# Patient Record
Sex: Female | Born: 1965 | Race: Black or African American | Hispanic: No | Marital: Married | Smoking: Never smoker
Health system: Southern US, Community
[De-identification: ages and names within clinical notes are randomized; demographics above are authoritative.]

## PROBLEM LIST (undated history)

## (undated) DIAGNOSIS — E0500 Thyrotoxicosis with diffuse goiter without thyrotoxic crisis or storm: Secondary | ICD-10-CM

## (undated) DIAGNOSIS — R87619 Unspecified abnormal cytological findings in specimens from cervix uteri: Secondary | ICD-10-CM

## (undated) HISTORY — PX: OTHER SURGICAL HISTORY: SHX169

## (undated) HISTORY — DX: Unspecified abnormal cytological findings in specimens from cervix uteri: R87.619

## (undated) HISTORY — DX: Thyrotoxicosis with diffuse goiter without thyrotoxic crisis or storm: E05.00

---

## 2006-09-28 HISTORY — PX: OTHER SURGICAL HISTORY: SHX169

## 2006-09-28 HISTORY — PX: COLPOSCOPY: SHX161

## 2016-11-03 ENCOUNTER — Other Ambulatory Visit: Payer: Self-pay | Admitting: Obstetrics and Gynecology

## 2016-11-03 DIAGNOSIS — K4030 Unilateral inguinal hernia, with obstruction, without gangrene, not specified as recurrent: Secondary | ICD-10-CM

## 2016-11-16 ENCOUNTER — Other Ambulatory Visit: Payer: Self-pay | Admitting: Obstetrics and Gynecology

## 2016-11-16 DIAGNOSIS — R591 Generalized enlarged lymph nodes: Secondary | ICD-10-CM

## 2016-11-16 DIAGNOSIS — K4030 Unilateral inguinal hernia, with obstruction, without gangrene, not specified as recurrent: Secondary | ICD-10-CM

## 2016-11-23 ENCOUNTER — Ambulatory Visit
Admission: RE | Admit: 2016-11-23 | Discharge: 2016-11-23 | Disposition: A | Discharge status: Home / Self Care | Payer: Managed Care, Other (non HMO) | Source: Ambulatory Visit | Attending: Obstetrics and Gynecology | Admitting: Obstetrics and Gynecology

## 2016-11-23 DIAGNOSIS — R591 Generalized enlarged lymph nodes: Secondary | ICD-10-CM | POA: Diagnosis not present

## 2016-11-23 DIAGNOSIS — K4030 Unilateral inguinal hernia, with obstruction, without gangrene, not specified as recurrent: Secondary | ICD-10-CM

## 2016-12-07 ENCOUNTER — Ambulatory Visit: Payer: Self-pay

## 2016-12-14 ENCOUNTER — Ambulatory Visit: Payer: Self-pay

## 2017-01-19 ENCOUNTER — Other Ambulatory Visit: Payer: Self-pay | Admitting: Obstetrics and Gynecology

## 2017-01-20 NOTE — Telephone Encounter (Signed)
AMS, Pt has an annual with you on 5/23. Please advise on refill

## 2017-01-20 NOTE — Telephone Encounter (Signed)
Pt aware, via voicemail, Rx was refilled until her appointment

## 2017-02-17 ENCOUNTER — Ambulatory Visit: Payer: Self-pay | Admitting: Obstetrics and Gynecology

## 2017-03-19 ENCOUNTER — Ambulatory Visit (INDEPENDENT_AMBULATORY_CARE_PROVIDER_SITE_OTHER): Payer: Managed Care, Other (non HMO) | Admitting: Obstetrics and Gynecology

## 2017-03-19 ENCOUNTER — Encounter: Payer: Self-pay | Admitting: Obstetrics and Gynecology

## 2017-03-19 DIAGNOSIS — Z01419 Encounter for gynecological examination (general) (routine) without abnormal findings: Secondary | ICD-10-CM | POA: Diagnosis not present

## 2017-03-19 DIAGNOSIS — Z1211 Encounter for screening for malignant neoplasm of colon: Secondary | ICD-10-CM | POA: Diagnosis not present

## 2017-03-19 DIAGNOSIS — E039 Hypothyroidism, unspecified: Secondary | ICD-10-CM | POA: Diagnosis not present

## 2017-03-19 NOTE — Patient Instructions (Signed)
Screening recommended starting at age 49 for average risk individuals, age 40 for individuals deemed at increased risk (including African Americans) and recommended to continue until age 75.  For patient age 76-85 individualized approach is recommended.  Gold standard screening is via colonoscopy, Cologuard screening is an acceptable alternative for patient unwilling or unable to undergo colonoscopy.  "Colorectal cancer screening for average?risk adults: 2018 guideline update from the American Cancer Society"CA: A Cancer Journal for Clinicians: Feb 24, 2017    Preventive Care 40-64 Years, Female Preventive care refers to lifestyle choices and visits with your health care provider that can promote health and wellness. What does preventive care include?  A yearly physical exam. This is also called an annual well check.  Dental exams once or twice a year.  Routine eye exams. Ask your health care provider how often you should have your eyes checked.  Personal lifestyle choices, including: ? Daily care of your teeth and gums. ? Regular physical activity. ? Eating a healthy diet. ? Avoiding tobacco and drug use. ? Limiting alcohol use. ? Practicing safe sex. ? Taking low-dose aspirin daily starting at age 50. ? Taking vitamin and mineral supplements as recommended by your health care provider. What happens during an annual well check? The services and screenings done by your health care provider during your annual well check will depend on your age, overall health, lifestyle risk factors, and family history of disease. Counseling Your health care provider may ask you questions about your:  Alcohol use.  Tobacco use.  Drug use.  Emotional well-being.  Home and relationship well-being.  Sexual activity.  Eating habits.  Work and work environment.  Method of birth control.  Menstrual cycle.  Pregnancy history.  Screening You may have the following tests or  measurements:  Height, weight, and BMI.  Blood pressure.  Lipid and cholesterol levels. These may be checked every 5 years, or more frequently if you are over 50 years old.  Skin check.  Lung cancer screening. You may have this screening every year starting at age 55 if you have a 30-pack-year history of smoking and currently smoke or have quit within the past 15 years.  Fecal occult blood test (FOBT) of the stool. You may have this test every year starting at age 50.  Flexible sigmoidoscopy or colonoscopy. You may have a sigmoidoscopy every 5 years or a colonoscopy every 10 years starting at age 50.  Hepatitis C blood test.  Hepatitis B blood test.  Sexually transmitted disease (STD) testing.  Diabetes screening. This is done by checking your blood sugar (glucose) after you have not eaten for a while (fasting). You may have this done every 1-3 years.  Mammogram. This may be done every 1-2 years. Talk to your health care provider about when you should start having regular mammograms. This may depend on whether you have a family history of breast cancer.  BRCA-related cancer screening. This may be done if you have a family history of breast, ovarian, tubal, or peritoneal cancers.  Pelvic exam and Pap test. This may be done every 3 years starting at age 21. Starting at age 30, this may be done every 5 years if you have a Pap test in combination with an HPV test.  Bone density scan. This is done to screen for osteoporosis. You may have this scan if you are at high risk for osteoporosis.  Discuss your test results, treatment options, and if necessary, the need for more tests with your   health care provider. Vaccines Your health care provider may recommend certain vaccines, such as:  Influenza vaccine. This is recommended every year.  Tetanus, diphtheria, and acellular pertussis (Tdap, Td) vaccine. You may need a Td booster every 10 years.  Varicella vaccine. You may need this if  you have not been vaccinated.  Zoster vaccine. You may need this after age 60.  Measles, mumps, and rubella (MMR) vaccine. You may need at least one dose of MMR if you were born in 1957 or later. You may also need a second dose.  Pneumococcal 13-valent conjugate (PCV13) vaccine. You may need this if you have certain conditions and were not previously vaccinated.  Pneumococcal polysaccharide (PPSV23) vaccine. You may need one or two doses if you smoke cigarettes or if you have certain conditions.  Meningococcal vaccine. You may need this if you have certain conditions.  Hepatitis A vaccine. You may need this if you have certain conditions or if you travel or work in places where you may be exposed to hepatitis A.  Hepatitis B vaccine. You may need this if you have certain conditions or if you travel or work in places where you may be exposed to hepatitis B.  Haemophilus influenzae type b (Hib) vaccine. You may need this if you have certain conditions.  Talk to your health care provider about which screenings and vaccines you need and how often you need them. This information is not intended to replace advice given to you by your health care provider. Make sure you discuss any questions you have with your health care provider. Document Released: 10/11/2015 Document Revised: 06/03/2016 Document Reviewed: 07/16/2015 Elsevier Interactive Patient Education  2017 Elsevier Inc.  

## 2017-03-19 NOTE — Progress Notes (Signed)
Patient ID: Holly [name], female   DOB: Apr 28, 1966, 51 y.o.   MRN: [id number]     Gynecology Annual Exam  PCP: Vena Austria, MD  Chief Complaint:  Chief Complaint  Patient presents with  . Gynecologic Exam    History of Present Illness: Patient is a 51 y.o. [id number] presents for annual exam. The patient has no complaints today.   LMP: Patient's last menstrual period was 03/12/2017 (exact date). Average Interval: regular, 28 days Duration of flow: 3-4 days Heavy Menses: no Clots: no Intermenstrual Bleeding: no Postcoital Bleeding: no Dysmenorrhea: no   The patient is sexually active. She currently uses IUD for contraception. She denies dyspareunia.  The patient does not perform self breast exams.  There is no notable family history of breast or ovarian cancer in her family.  The patient wears seatbelts: yes.   The patient has regular exercise: yes.    The patient denies current symptoms of depression.    Review of Systems: Review of Systems  Constitutional: Negative for chills and fever.  HENT: Negative for congestion.   Respiratory: Negative for cough and shortness of breath.   Cardiovascular: Negative for chest pain and palpitations.  Gastrointestinal: Negative for abdominal pain, constipation, diarrhea, heartburn, nausea and vomiting.  Genitourinary: Negative for dysuria, frequency and urgency.  Skin: Negative for itching and rash.  Neurological: Negative for dizziness and headaches.  Endo/Heme/Allergies: Negative for polydipsia.  Psychiatric/Behavioral: Negative for depression.    Past Medical History:  History reviewed. No pertinent past medical history.  Past Surgical History:  History reviewed. No pertinent surgical history.  Gynecologic History:  Patient's last menstrual period was 03/12/2017 (exact date). Contraception: IUD Mirena 01/16/12 Last Pap: Results were: 01/18/2015 HPV primary screening negative Last mammogram: 12/03/14 [facility]) Results  were: BI-RAD I Obstetric History: [id number]  Family History:  History reviewed. No pertinent family history.  Social History:  Social History   Social History  . Marital status: Married    Spouse name: N/A  . Number of children: N/A  . Years of education: N/A   Occupational History  . Not on file.   Social History Main Topics  . Smoking status: Never Smoker  . Smokeless tobacco: Never Used  . Alcohol use Yes  . Drug use: No  . Sexual activity: Yes    Partners: Male    Birth control/ protection: IUD   Other Topics Concern  . Not on file   Social History Narrative  . No narrative on file    Allergies:  Allergies  Allergen Reactions  . Propylthiouracil Rash    Medications: Prior to Admission medications   Medication Sig Start Date End Date Taking? Authorizing Provider  levothyroxine (SYNTHROID, LEVOTHROID) 88 MCG tablet TAKE 1 TABLET BY MOUTH  DAILY 01/20/17  Yes Vena Austria, MD  LORazepam (ATIVAN) 1 MG tablet 1-2 tablets preflight 06/20/13  Yes [provider]  propranolol (INDERAL) 10 MG tablet Take 10 mg by mouth 3 (three) times daily.   Yes [provider]    Physical Exam Vitals: Blood pressure 126/82, pulse 65, height 5\' 4"  (1.626 m), weight 152 lb (68.9 kg), last menstrual period 03/12/2017.  General: NAD HEENT: normocephalic, anicteric Thyroid: no enlargement, no palpable nodules Pulmonary: No increased work of breathing, CTAB Cardiovascular: RRR, distal pulses 2+ Breast: Breast symmetrical, no tenderness, no palpable nodules or masses, no skin or nipple retraction present, no nipple discharge.  No axillary or supraclavicular lymphadenopathy. Abdomen: NABS, soft, non-tender, non-distended.  Umbilicus without lesions.  No  hepatomegaly, splenomegaly or masses palpable. No evidence of hernia  Genitourinary:  External: Normal external female genitalia.  Normal urethral meatus, normal  Bartholin's and Skene's glands.    Vagina: Normal  vaginal mucosa, no evidence of prolapse.    Cervix: Grossly normal in appearance, no bleeding, IUD strings visualized  Uterus: Non-enlarged, mobile, normal contour.  No CMT  Adnexa: ovaries non-enlarged, no adnexal masses  Rectal: deferred  Lymphatic: no evidence of inguinal lymphadenopathy Extremities: no edema, erythema, or tenderness Neurologic: Grossly intact Psychiatric: mood appropriate, affect full  Female chaperone present for pelvic and breast  portions of the physical exam    Assessment: 51 y.o. [id number] No problem-specific Assessment & Plan notes found for this encounter.   Plan: Problem List Items Addressed This Visit    None     1) Mammogram - recommend yearly screening mammogram.  Mammogram Was ordered today   2) STI screening was offered and declined  3) ASCCP guidelines and rational discussed.  Patient opts for every 3 years screening interval  4) Contraception - continue IUD, plan on use for 51 year remove 01/16/19  5) Colonoscopy -- Screening recommended starting at age 49 for average risk individuals, age 49 for individuals deemed at increased risk (including African Americans) and recommended to continue until age 49.  For patient age 11076-85 individualized approach is recommended.  Gold standard screening is via colonoscopy, Cologuard screening is an acceptable alternative for patient unwilling or unable to undergo colonoscopy.  "Colorectal cancer screening for average?risk adults: 2018 guideline update from the American Cancer Society"CA: A Cancer Journal for Clinicians: Feb 24, 2017  - opts for cologuard testing  6) Routine healthcare maintenance including cholesterol, diabetes screening discussed Declines (getting done via [facility])

## 2017-03-20 LAB — THYROID PANEL
FREE THYROXINE INDEX: 2.2 (ref 1.2–4.9)
T3 UPTAKE RATIO: 30 % (ref 24–39)
T4 TOTAL: 7.3 ug/dL (ref 4.5–12.0)

## 2017-03-24 ENCOUNTER — Other Ambulatory Visit: Payer: Self-pay | Admitting: Obstetrics and Gynecology

## 2017-03-24 MED ORDER — LEVOTHYROXINE SODIUM 88 MCG PO TABS: 88.0000 ug | ORAL_TABLET | Freq: Every day | ORAL | 3 refills | Status: DC

## 2017-03-25 ENCOUNTER — Ambulatory Visit: Payer: Self-pay | Admitting: Obstetrics and Gynecology

## 2017-04-06 ENCOUNTER — Other Ambulatory Visit: Payer: Self-pay | Admitting: Obstetrics and Gynecology

## 2017-04-06 ENCOUNTER — Telehealth: Payer: Self-pay | Admitting: Obstetrics and Gynecology

## 2017-04-06 DIAGNOSIS — Z1231 Encounter for screening mammogram for malignant neoplasm of breast: Secondary | ICD-10-CM

## 2017-04-06 NOTE — Telephone Encounter (Signed)
I am looking into this through [employer]. KJ CMA

## 2017-04-06 NOTE — Telephone Encounter (Signed)
MyChart message sent to patient.

## 2017-04-06 NOTE — Telephone Encounter (Signed)
PT is calling today wanting to know when she would be receiving her Colorguard in the mail and also when would she here about her mammogram appt. Please advise patient.cb# [phone] may detailed message

## 2017-04-26 ENCOUNTER — Ambulatory Visit
Admission: RE | Admit: 2017-04-26 | Discharge: 2017-04-26 | Disposition: A | Discharge status: Home / Self Care | Payer: Managed Care, Other (non HMO) | Source: Ambulatory Visit | Attending: Obstetrics and Gynecology | Admitting: Obstetrics and Gynecology

## 2017-04-26 ENCOUNTER — Other Ambulatory Visit: Payer: Self-pay | Admitting: *Deleted

## 2017-04-26 ENCOUNTER — Inpatient Hospital Stay
Admission: RE | Admit: 2017-04-26 | Discharge: 2017-04-26 | Disposition: A | Discharge status: Home / Self Care | Payer: Self-pay | Source: Ambulatory Visit | Attending: *Deleted | Admitting: *Deleted

## 2017-04-26 DIAGNOSIS — Z9289 Personal history of other medical treatment: Secondary | ICD-10-CM

## 2017-04-26 DIAGNOSIS — Z1231 Encounter for screening mammogram for malignant neoplasm of breast: Secondary | ICD-10-CM

## 2017-04-27 ENCOUNTER — Encounter: Payer: Self-pay | Admitting: Obstetrics and Gynecology

## 2018-02-12 ENCOUNTER — Other Ambulatory Visit: Payer: Self-pay | Admitting: Obstetrics and Gynecology

## 2018-02-14 NOTE — Telephone Encounter (Signed)
Annual was 03/19/2017, not seen since then and no upcoming appointments scheduled. Please advise

## 2018-07-26 ENCOUNTER — Encounter: Payer: Self-pay | Admitting: Obstetrics and Gynecology

## 2018-07-27 ENCOUNTER — Encounter: Payer: Self-pay | Admitting: Obstetrics and Gynecology

## 2018-07-27 ENCOUNTER — Ambulatory Visit (INDEPENDENT_AMBULATORY_CARE_PROVIDER_SITE_OTHER): Payer: Managed Care, Other (non HMO) | Admitting: Obstetrics and Gynecology

## 2018-07-27 VITALS — BP 130/70 | HR 108 | Ht 64.0 in | Wt 164.0 lb

## 2018-07-27 DIAGNOSIS — E038 Other specified hypothyroidism: Secondary | ICD-10-CM

## 2018-07-27 DIAGNOSIS — Z01419 Encounter for gynecological examination (general) (routine) without abnormal findings: Secondary | ICD-10-CM | POA: Diagnosis not present

## 2018-07-27 DIAGNOSIS — N951 Menopausal and female climacteric states: Secondary | ICD-10-CM

## 2018-07-27 DIAGNOSIS — Z124 Encounter for screening for malignant neoplasm of cervix: Secondary | ICD-10-CM

## 2018-07-27 DIAGNOSIS — Z1239 Encounter for other screening for malignant neoplasm of breast: Secondary | ICD-10-CM

## 2018-07-27 DIAGNOSIS — Z1211 Encounter for screening for malignant neoplasm of colon: Secondary | ICD-10-CM

## 2018-07-27 MED ORDER — LORAZEPAM 1 MG PO TABS: 1.0000 mg | ORAL_TABLET | Freq: Three times a day (TID) | ORAL | 0 refills | Status: DC | PRN

## 2018-07-27 MED ORDER — LEVOTHYROXINE SODIUM 88 MCG PO TABS: 88.0000 ug | ORAL_TABLET | Freq: Every day | ORAL | 3 refills | Status: DC

## 2018-07-27 MED ORDER — PROPRANOLOL HCL 10 MG PO TABS
10.0000 mg | ORAL_TABLET | Freq: Three times a day (TID) | ORAL | 3 refills | Status: DC | PRN
Start: 2018-07-27 — End: 2020-09-05

## 2018-07-27 NOTE — Patient Instructions (Signed)
[facility] [address] [place] [address]  [facility] [place]  [address] [place] [address]  Phone: [phone]  

## 2018-07-27 NOTE — Progress Notes (Signed)
Gynecology Annual Exam  PCP: Vena Austria, MD  Chief Complaint:  Chief Complaint  Patient presents with  . Gynecologic Exam    History of Present Illness: Patient is a 52 y.o. [id number] presents for annual exam. The patient has no complaints today.   LMP: Patient's last menstrual period was 07/12/2018 (exact date). Irregular with Mirena IUD in place  The patient is sexually active. She currently uses IUD for contraception. She denies dyspareunia.  The patient does perform self breast exams.  There is no notable family history of breast or ovarian cancer in her family.  The patient wears seatbelts: yes.   The patient has regular exercise: not asked.    Review of Systems: Review of Systems  Constitutional: Negative for chills and fever.  HENT: Negative for congestion.   Respiratory: Negative for cough and shortness of breath.   Cardiovascular: Negative for chest pain and palpitations.  Gastrointestinal: Negative for abdominal pain, constipation, diarrhea, heartburn, nausea and vomiting.  Genitourinary: Negative for dysuria, frequency and urgency.  Skin: Negative for itching and rash.  Neurological: Negative for dizziness and headaches.  Endo/Heme/Allergies: Negative for polydipsia.  Psychiatric/Behavioral: Negative for depression.    Past Medical History:  Past Medical History:  Diagnosis Date  . Abnormal Pap smear of cervix   . Graves disease     Past Surgical History:  Past Surgical History:  Procedure Laterality Date  . COLPOSCOPY  2008   Mild Dysplasia  . ECC  2008   Benign Tissue  . Mirena  01/25/07 & 01/07/2012    Gynecologic History:  Patient's last menstrual period was 07/12/2018 (exact date). Contraception: IUD 01/16/2012 Last Pap: Results were: 01/18/2015 HPV primary screening negative Last mammogram: 04/26/2017 Results were: BI-RAD I  Obstetric History: [id number]  Family History:  Family History  Problem Relation Age of Onset  . Breast cancer  Neg Hx     Social History:  Social History   Socioeconomic History  . Marital status: Married    Spouse name: Not on file  . Number of children: Not on file  . Years of education: Not on file  . Highest education level: Not on file  Occupational History  . Not on file  Social Needs  . Financial resource strain: Not on file  . Food insecurity:    Worry: Not on file    Inability: Not on file  . Transportation needs:    Medical: Not on file    Non-medical: Not on file  Tobacco Use  . Smoking status: Never Smoker  . Smokeless tobacco: Never Used  Substance and Sexual Activity  . Alcohol use: Yes  . Drug use: No  . Sexual activity: Yes    Partners: Male    Birth control/protection: IUD  Lifestyle  . Physical activity:    Days per week: Not on file    Minutes per session: Not on file  . Stress: Not on file  Relationships  . Social connections:    Talks on phone: Not on file    Gets together: Not on file    Attends religious service: Not on file    Active member of club or organization: Not on file    Attends meetings of clubs or organizations: Not on file    Relationship status: Not on file  . Intimate partner violence:    Fear of current or ex partner: Not on file    Emotionally abused: Not on file    Physically abused: Not on file  Forced sexual activity: Not on file  Other Topics Concern  . Not on file  Social History Narrative  . Not on file    Allergies:  Allergies  Allergen Reactions  . Propylthiouracil Rash    Medications: Prior to Admission medications   Medication Sig Start Date End Date Taking? Authorizing Provider  Multiple Vitamins-Minerals (MULTIVITAMIN WITH MINERALS) tablet Take by mouth. 03/30/03  Yes [provider]  levonorgestrel (MIRENA) 20 MCG/24HR IUD by Intrauterine route.    [provider]  levothyroxine (SYNTHROID, LEVOTHROID) 88 MCG tablet TAKE 1 TABLET BY MOUTH  DAILY 02/14/18   Vena Austria, MD  LORazepam  (ATIVAN) 1 MG tablet 1-2 tablets preflight 06/20/13   [provider]  propranolol (INDERAL) 10 MG tablet Take 10 mg by mouth 3 (three) times daily.    [provider]    Physical Exam Vitals: Blood pressure 130/70, pulse (!) 108, height 5\' 4"  (1.626 m), weight 164 lb (74.4 kg), last menstrual period 07/12/2018.  General: NAD HEENT: normocephalic, anicteric Thyroid: no enlargement, no palpable nodules Pulmonary: No increased work of breathing, CTAB Cardiovascular: RRR, distal pulses 2+ Breast: Breast symmetrical, no tenderness, no palpable nodules or masses, no skin or nipple retraction present, no nipple discharge.  No axillary or supraclavicular lymphadenopathy. Abdomen: NABS, soft, non-tender, non-distended.  Umbilicus without lesions.  No hepatomegaly, splenomegaly or masses palpable. No evidence of hernia  Genitourinary:  External: Normal external female genitalia.  Normal urethral meatus, normal Bartholin's and Skene's glands.    Vagina: Normal vaginal mucosa, no evidence of prolapse.    Cervix: Grossly normal in appearance, no bleeding, IUD string visualized  Uterus: Non-enlarged, mobile, normal contour.  No CMT  Adnexa: ovaries non-enlarged, no adnexal masses  Rectal: deferred  Lymphatic: no evidence of inguinal lymphadenopathy Extremities: no edema, erythema, or tenderness Neurologic: Grossly intact Psychiatric: mood appropriate, affect full  Female chaperone present for pelvic and breast  portions of the physical exam    Assessment: 52 y.o. [id number] routine annual exam  Plan: Problem List Items Addressed This Visit    None    Visit Diagnoses    Screening for malignant neoplasm of cervix    -  Primary   Relevant Orders   PapIG, HPV, rfx 16/18   Breast screening       Relevant Orders   MM 3D SCREEN BREAST BILATERAL   Encounter for gynecological examination without abnormal finding       Other specified hypothyroidism       Relevant Medications    propranolol (INDERAL) 10 MG tablet   levothyroxine (SYNTHROID, LEVOTHROID) 88 MCG tablet   Other Relevant Orders   Thyroid Panel With TSH   Colon cancer screening       Relevant Orders   IFOBT POC (occult bld, rslt in office)   Perimenopause       Relevant Orders   Follicle stimulating hormone   Estradiol      1) Mammogram - recommend yearly screening mammogram.  Mammogram Was ordered today   2) STI screening  was notoffered and therefore not obtained  3) ASCCP guidelines and rational discussed.  Patient opts for every 3 years screening interval (last testing HPV only)  4) Contraception - the patient is currently using  IUD.  She is happy with her current form of contraception and plans to continue  5) Colonoscopy -- opts for [facility] fecal immunochemical testing  6) Routine healthcare maintenance including cholesterol, diabetes screening discussed managed by PCP  7) Flying to  [place] - rx ativan and propanolol  8) Return in about 6 months (around 01/26/2019) for IUD removal.   Vena Austria, MD, [facility], [facility] 07/27/2018, 2:52 PM       '

## 2018-07-29 LAB — FOLLICLE STIMULATING HORMONE

## 2018-07-29 LAB — THYROID PANEL WITH TSH

## 2018-07-29 LAB — ESTRADIOL

## 2018-07-30 LAB — PAPIG, HPV, RFX 16/18: HPV, high-risk: NEGATIVE

## 2018-07-31 ENCOUNTER — Other Ambulatory Visit: Payer: Self-pay | Admitting: Obstetrics and Gynecology

## 2018-08-02 ENCOUNTER — Telehealth: Payer: Self-pay | Admitting: Obstetrics and Gynecology

## 2018-08-02 NOTE — Telephone Encounter (Signed)
Left voicemail for patient to call back to be schedule to recollect labs per [place]. Mychart message sent

## 2018-08-02 NOTE — Telephone Encounter (Signed)
Needs labs redrawn from last visit

## 2018-08-02 NOTE — Telephone Encounter (Signed)
Left voicemail this morning and sent mychart message to reach patient. Waiting on Patient action.

## 2018-08-02 NOTE — Telephone Encounter (Signed)
Patient is schedule 08/11/18 for re draw labs. Please advise order

## 2018-08-04 LAB — FECAL OCCULT BLOOD, IMMUNOCHEMICAL: Fecal Occult Bld: NEGATIVE

## 2018-08-11 ENCOUNTER — Other Ambulatory Visit: Payer: Self-pay | Admitting: Obstetrics and Gynecology

## 2018-08-11 ENCOUNTER — Other Ambulatory Visit: Payer: Managed Care, Other (non HMO)

## 2018-08-11 DIAGNOSIS — N951 Menopausal and female climacteric states: Secondary | ICD-10-CM

## 2018-08-12 LAB — ESTRADIOL: ESTRADIOL: 37 pg/mL

## 2018-08-12 LAB — FOLLICLE STIMULATING HORMONE: FSH: 11.5 m[IU]/mL

## 2018-08-16 ENCOUNTER — Ambulatory Visit
Admission: RE | Admit: 2018-08-16 | Discharge: 2018-08-16 | Disposition: A | Discharge status: Home / Self Care | Payer: Managed Care, Other (non HMO) | Source: Ambulatory Visit | Attending: Obstetrics and Gynecology | Admitting: Obstetrics and Gynecology

## 2018-08-16 DIAGNOSIS — Z1239 Encounter for other screening for malignant neoplasm of breast: Secondary | ICD-10-CM | POA: Diagnosis present

## 2018-08-16 LAB — TSH: TSH: 2.37 u[IU]/mL (ref 0.450–4.500)

## 2018-08-16 LAB — SPECIMEN STATUS REPORT

## 2018-08-16 LAB — T4, FREE: Free T4: 1.41 ng/dL (ref 0.82–1.77)

## 2018-10-13 ENCOUNTER — Telehealth: Payer: Self-pay | Admitting: Obstetrics and Gynecology

## 2018-10-13 NOTE — Telephone Encounter (Signed)
Pt coming in for Mirena remova;/insertion on01/27/20 at 8:10 am with AMS

## 2018-10-24 ENCOUNTER — Encounter: Payer: Self-pay | Admitting: Obstetrics and Gynecology

## 2018-10-24 ENCOUNTER — Ambulatory Visit (INDEPENDENT_AMBULATORY_CARE_PROVIDER_SITE_OTHER): Payer: Managed Care, Other (non HMO) | Admitting: Obstetrics and Gynecology

## 2018-10-24 VITALS — BP 122/88 | HR 101 | Ht 64.0 in | Wt 173.0 lb

## 2018-10-24 DIAGNOSIS — Z6829 Body mass index (BMI) 29.0-29.9, adult: Secondary | ICD-10-CM

## 2018-10-24 DIAGNOSIS — E663 Overweight: Secondary | ICD-10-CM

## 2018-10-24 DIAGNOSIS — Z30433 Encounter for removal and reinsertion of intrauterine contraceptive device: Secondary | ICD-10-CM

## 2018-10-24 DIAGNOSIS — Z1211 Encounter for screening for malignant neoplasm of colon: Secondary | ICD-10-CM

## 2018-10-24 MED ORDER — PHENTERMINE HCL 37.5 MG PO TABS: 37.5000 mg | ORAL_TABLET | Freq: Every day | ORAL | 0 refills | Status: DC

## 2018-10-24 NOTE — Progress Notes (Signed)
    GYNECOLOGY OFFICE PROCEDURE NOTE  Holly [name] is a 53 y.o. [id number] here for IUD removal and reinsertion. The patient currently has a Miena IUD placed on 07/12/2018, which will be replaced with a Mirena IUD today.  No GYN concerns.  Last pap smear was on 07/27/2018 and was NIL HPV negative.  Blood pressure 122/88, pulse (!) 101, height 5\' 4"  (1.626 m), weight 173 lb (78.5 kg). Body mass index is 29.7 kg/m.   IUD Removal and Reinsertion  Patient identified, informed consent performed, consent signed.   Discussed risks of irregular bleeding, cramping, infection, malpositioning or uterine perforation of the IUD which may require further procedures. Time out was performed. Speculum placed in the vagina. The strings of the IUD were grasped and pulled using ring forceps. The IUD was successfully removed in its entirety. The cervix was cleaned with Betadine x 2 and grasped anteriorly with a single tooth tenaculum.  The uterus was sounded to IUD insertion apparatus was used to sound the uterus to 7cm using a uterine sound.  The IUD was then placed per manufacturer's recommendations. Strings trimmed to 3 cm. Tenaculum was removed, good hemostasis noted. Patient tolerated procedure well.   Patient was given post-procedure instructions.  Patient was also asked to check IUD strings periodically and follow up in 4weeks for IUD check.  Has noted some weight gain would like repeat course of phentermine Rx sent follow up 4 weeks for weight and string check.  Vena Austria, MD, [facility], [facility]

## 2018-11-03 LAB — COLOGUARD: COLOGUARD: NEGATIVE

## 2018-11-21 ENCOUNTER — Encounter: Payer: Self-pay | Admitting: Obstetrics and Gynecology

## 2018-11-21 ENCOUNTER — Ambulatory Visit (INDEPENDENT_AMBULATORY_CARE_PROVIDER_SITE_OTHER): Payer: Managed Care, Other (non HMO) | Admitting: Obstetrics and Gynecology

## 2018-11-21 VITALS — BP 140/88 | HR 83 | Wt 172.0 lb

## 2018-11-21 DIAGNOSIS — Z30431 Encounter for routine checking of intrauterine contraceptive device: Secondary | ICD-10-CM | POA: Diagnosis not present

## 2018-11-21 NOTE — Progress Notes (Signed)
Obstetrics & Gynecology Office Visit   Chief Complaint:  Chief Complaint  Patient presents with  . Follow-up  . Weight Check    History of Present Illness: 53 y.o. patient presenting for follow up of Mirena IUD placement 4 weeks ago.  The indication for her IUD was contraception.  She denies any complications since her IUD placement.  Still having some occasional spotting.  Has not checked strings  Review of Systems: ROS  Past Medical History:  Past Medical History:  Diagnosis Date  . Abnormal Pap smear of cervix   . Graves disease     Past Surgical History:  Past Surgical History:  Procedure Laterality Date  . COLPOSCOPY  2008   Mild Dysplasia  . ECC  2008   Benign Tissue  . Mirena  01/25/07 & 01/07/2012    Gynecologic History: No LMP recorded. (Menstrual status: IUD).  Obstetric History: [id number]  Family History:  Family History  Problem Relation Age of Onset  . Breast cancer Neg Hx     Social History:  Social History   Socioeconomic History  . Marital status: Married    Spouse name: Not on file  . Number of children: Not on file  . Years of education: Not on file  . Highest education level: Not on file  Occupational History  . Not on file  Social Needs  . Financial resource strain: Not on file  . Food insecurity:    Worry: Not on file    Inability: Not on file  . Transportation needs:    Medical: Not on file    Non-medical: Not on file  Tobacco Use  . Smoking status: Never Smoker  . Smokeless tobacco: Never Used  Substance and Sexual Activity  . Alcohol use: Yes  . Drug use: No  . Sexual activity: Yes    Partners: Male    Birth control/protection: I.U.D.  Lifestyle  . Physical activity:    Days per week: Not on file    Minutes per session: Not on file  . Stress: Not on file  Relationships  . Social connections:    Talks on phone: Not on file    Gets together: Not on file    Attends religious service: Not on file    Active member of  club or organization: Not on file    Attends meetings of clubs or organizations: Not on file    Relationship status: Not on file  . Intimate partner violence:    Fear of current or ex partner: Not on file    Emotionally abused: Not on file    Physically abused: Not on file    Forced sexual activity: Not on file  Other Topics Concern  . Not on file  Social History Narrative  . Not on file    Allergies:  Allergies  Allergen Reactions  . Propylthiouracil Rash    Medications: Prior to Admission medications   Medication Sig Start Date End Date Taking? Authorizing Provider  Biotin 1000 MCG CHEW Chew by mouth.   Yes [provider]  levonorgestrel (MIRENA) 20 MCG/24HR IUD by Intrauterine route.   Yes [provider]  levothyroxine (SYNTHROID, LEVOTHROID) 88 MCG tablet Take 1 tablet (88 mcg total) by mouth daily. 07/27/18  Yes Vena Austria, MD  LORazepam (ATIVAN) 1 MG tablet Take 1 tablet (1 mg total) by mouth every 8 (eight) hours as needed for anxiety. 07/27/18  Yes Vena Austria, MD  Lysine 500 MG TABS Take by  mouth.   Yes [provider]  Multiple Vitamins-Minerals (MULTIVITAMIN WITH MINERALS) tablet Take by mouth. 03/30/03  Yes [provider]  propranolol (INDERAL) 10 MG tablet Take 1 tablet (10 mg total) by mouth 3 (three) times daily as needed (anxiety). 07/27/18  Yes Vena Austria, MD  vitamin B-12 (CYANOCOBALAMIN) 1000 MCG tablet Take 1,000 mcg by mouth daily.   Yes [provider]  phentermine (ADIPEX-P) 37.5 MG tablet Take 1 tablet (37.5 mg total) by mouth daily before breakfast. Patient not taking: Reported on 11/21/2018 10/24/18   Vena Austria, MD    Physical Exam Blood pressure 140/88, pulse 83, weight 172 lb (78 kg). No LMP recorded. (Menstrual status: IUD).  General: NAD HEENT: normocephalic, anicteric Pulmonary: No increased work of breathing  Genitourinary:  External: Normal external female genitalia.   Normal urethral meatus, normal  Bartholin's and Skene's glands.    Vagina: Normal vaginal mucosa, no evidence of prolapse.    Cervix: Grossly normal in appearance, no bleeding, IUD strings visualized 2cm  Uterus: Non-enlarged, mobile, normal contour.  No CMT  Adnexa: ovaries non-enlarged, no adnexal masses  Rectal: deferred  Lymphatic: no evidence of inguinal lymphadenopathy Extremities: no edema, erythema, or tenderness Neurologic: Grossly intact Psychiatric: mood appropriate, affect full  Female chaperone present for pelvic and breast  portions of the physical exam  Assessment: 53 y.o. [id number] No problem-specific Assessment & Plan notes found for this encounter.   Plan: Problem List Items Addressed This Visit    None    Visit Diagnoses    IUD check up    -  Primary       1.  The patient was given instructions to check her IUD strings monthly and call with any problems or concerns.  She should call for fevers, chills, abnormal vaginal discharge, pelvic pain, or other complaints.  2.   IUDs while effective at preventing pregnancy do not prevent transmission of sexually transmitted diseases and use of barrier methods for this purpose was discussed.  Low overall incidence of failure with 99.7% efficacy rate in typical use.  The patient has not contraindication to IUD placement.  3.  She will return for a annual exam in 1 year.  All questions answered.  4) Discontinued phentermine secondary to side-effects.  Did not like how it affected moods.  We discussed alternatives.  She has previously tried [place] without success.  She has not tried Curator and was given a handout on it.  5) A total of 15 minutes were spent in face-to-face contact with the patient during this encounter with over half of that time devoted to counseling and coordination of care.  5) Return in about 8 months (around 07/22/2019) for annual.   Vena Austria, MD, [facility], [facility] 11/21/2018, 8:40 AM

## 2019-06-02 ENCOUNTER — Other Ambulatory Visit: Payer: Self-pay | Admitting: Obstetrics and Gynecology

## 2019-08-16 ENCOUNTER — Other Ambulatory Visit: Payer: Self-pay

## 2019-08-16 ENCOUNTER — Encounter: Payer: Self-pay | Admitting: Obstetrics and Gynecology

## 2019-08-16 ENCOUNTER — Ambulatory Visit (INDEPENDENT_AMBULATORY_CARE_PROVIDER_SITE_OTHER): Payer: Managed Care, Other (non HMO) | Admitting: Obstetrics and Gynecology

## 2019-08-16 VITALS — BP 135/73 | HR 102 | Ht 64.0 in | Wt 184.0 lb

## 2019-08-16 DIAGNOSIS — Z13220 Encounter for screening for lipoid disorders: Secondary | ICD-10-CM

## 2019-08-16 DIAGNOSIS — Z01419 Encounter for gynecological examination (general) (routine) without abnormal findings: Secondary | ICD-10-CM

## 2019-08-16 DIAGNOSIS — Z131 Encounter for screening for diabetes mellitus: Secondary | ICD-10-CM

## 2019-08-16 DIAGNOSIS — Z1239 Encounter for other screening for malignant neoplasm of breast: Secondary | ICD-10-CM

## 2019-08-16 DIAGNOSIS — Z1329 Encounter for screening for other suspected endocrine disorder: Secondary | ICD-10-CM

## 2019-08-16 DIAGNOSIS — Z1321 Encounter for screening for nutritional disorder: Secondary | ICD-10-CM

## 2019-08-16 DIAGNOSIS — N951 Menopausal and female climacteric states: Secondary | ICD-10-CM

## 2019-08-16 DIAGNOSIS — Z30431 Encounter for routine checking of intrauterine contraceptive device: Secondary | ICD-10-CM

## 2019-08-16 MED ORDER — SUMATRIPTAN SUCCINATE 100 MG PO TABS: 100.0000 mg | ORAL_TABLET | Freq: Once | ORAL | 11 refills | Status: DC | PRN

## 2019-08-16 NOTE — Patient Instructions (Signed)
[facility] [address] [place] [address]  [facility] [place]  [address] [place]  [address]  Phone: [phone]  

## 2019-08-16 NOTE — Progress Notes (Signed)
Gynecology Annual Exam  PCP: Vena AustriaStaebler, Sunjai Levandoski, MD  Chief Complaint:  Chief Complaint  Patient presents with  . Gynecologic Exam    Migraines    History of Present Illness:Patient is a 53 y.o. [id number] presents for annual exam. The patient has no complaints today.   LMP: No LMP recorded. (Menstrual status: IUD). No menstrual concerns  The patient is sexually active. She denies dyspareunia.  There is no notable family history of breast or ovarian cancer in her family.  The patient wears seatbelts: yes.   The patient has regular exercise: not asked.    The patient denies current symptoms of depression.     Has had about 3-4 Migraines this year, associated with nausea or emesis.  No other neurological symptoms and symptom free after resolution.    Review of Systems: Review of Systems  Constitutional: Negative for chills and fever.  HENT: Negative for congestion.   Respiratory: Negative for cough and shortness of breath.   Cardiovascular: Negative for chest pain and palpitations.  Gastrointestinal: Negative for abdominal pain, constipation, diarrhea, heartburn, nausea and vomiting.  Genitourinary: Negative for dysuria, frequency and urgency.  Skin: Negative for itching and rash.  Neurological: Positive for headaches. Negative for dizziness.  Endo/Heme/Allergies: Negative for polydipsia.  Psychiatric/Behavioral: Negative for depression.    Past Medical History:  Past Medical History:  Diagnosis Date  . Abnormal Pap smear of cervix   . Graves disease     Past Surgical History:  Past Surgical History:  Procedure Laterality Date  . COLPOSCOPY  2008   Mild Dysplasia  . ECC  2008   Benign Tissue  . Mirena  01/25/07 & 01/07/2012    Gynecologic History:  No LMP recorded. (Menstrual status: IUD). Contraception: Mirena IUD 10/24/2018 Last Pap: Results were: 07/27/2018 NIL and HR HPV negative  Last mammogram: 08/16/2018 Results were: BI-RAD I  Obstetric History:  [id number]:  G3P2012  Family History:  Family History  Problem Relation Age of Onset  . Breast cancer Neg Hx     Social History:  Social History   Socioeconomic History  . Marital status: Married    Spouse name: Not on file  . Number of children: Not on file  . Years of education: Not on file  . Highest education level: Not on file  Occupational History  . Not on file  Social Needs  . Financial resource strain: Not on file  . Food insecurity    Worry: Not on file    Inability: Not on file  . Transportation needs    Medical: Not on file    Non-medical: Not on file  Tobacco Use  . Smoking status: Never Smoker  . Smokeless tobacco: Never Used  Substance and Sexual Activity  . Alcohol use: Yes  . Drug use: No  . Sexual activity: Yes    Partners: Male    Birth control/protection: I.U.D.  Lifestyle  . Physical activity    Days per week: Not on file    Minutes per session: Not on file  . Stress: Not on file  Relationships  . Social Musicianconnections    Talks on phone: Not on file    Gets together: Not on file    Attends religious service: Not on file    Active member of club or organization: Not on file    Attends meetings of clubs or organizations: Not on file    Relationship status: Not on file  . Intimate partner violence    Fear of  current or ex partner: Not on file    Emotionally abused: Not on file    Physically abused: Not on file    Forced sexual activity: Not on file  Other Topics Concern  . Not on file  Social History Narrative  . Not on file    Allergies:  Allergies  Allergen Reactions  . Propylthiouracil Rash    Medications: Prior to Admission medications   Medication Sig Start Date End Date Taking? Authorizing Provider  Biotin 1000 MCG CHEW Chew by mouth.    [provider]  levonorgestrel (MIRENA) 20 MCG/24HR IUD by Intrauterine route.    [provider]  levothyroxine (SYNTHROID) 88 MCG tablet TAKE 1 TABLET BY MOUTH  DAILY 06/03/19    Malachy Mood, MD  LORazepam (ATIVAN) 1 MG tablet Take 1 tablet (1 mg total) by mouth every 8 (eight) hours as needed for anxiety. 07/27/18   Malachy Mood, MD  Lysine 500 MG TABS Take by mouth.    [provider]  Multiple Vitamins-Minerals (MULTIVITAMIN WITH MINERALS) tablet Take by mouth. 03/30/03   [provider]  phentermine (ADIPEX-P) 37.5 MG tablet Take 1 tablet (37.5 mg total) by mouth daily before breakfast. Patient not taking: Reported on 11/21/2018 10/24/18   Malachy Mood, MD  propranolol (INDERAL) 10 MG tablet Take 1 tablet (10 mg total) by mouth 3 (three) times daily as needed (anxiety). 07/27/18   Malachy Mood, MD  vitamin B-12 (CYANOCOBALAMIN) 1000 MCG tablet Take 1,000 mcg by mouth daily.    [provider]    Physical Exam Vitals: Blood pressure 135/73, pulse (!) 102, height 5\' 4"  (1.626 m), weight 184 lb (83.5 kg).   General: NAD HEENT: normocephalic, anicteric Thyroid: no enlargement, no palpable nodules Pulmonary: No increased work of breathing, CTAB Cardiovascular: RRR, distal pulses 2+ Breast: Breast symmetrical, no tenderness, no palpable nodules or masses, no skin or nipple retraction present, no nipple discharge.  No axillary or supraclavicular lymphadenopathy. Abdomen: NABS, soft, non-tender, non-distended.  Umbilicus without lesions.  No hepatomegaly, splenomegaly or masses palpable. No evidence of hernia  Genitourinary:  External: Normal external female genitalia.  Normal urethral meatus, normal Bartholin's and Skene's glands.    Vagina: Normal vaginal mucosa, no evidence of prolapse.    Cervix: Grossly normal in appearance, no bleeding. IUD strings visualized 2cm  Uterus: Non-enlarged, mobile, normal contour.  No CMT  Adnexa: ovaries non-enlarged, no adnexal masses  Rectal: deferred  Lymphatic: no evidence of inguinal lymphadenopathy Extremities: no edema, erythema, or tenderness Neurologic: Grossly intact  Psychiatric: mood appropriate, affect full  Female chaperone present for pelvic and breast  portions of the physical exam     Assessment: 54 y.o. [id number] routine annual exam  Plan: Problem List Items Addressed This Visit    None    Visit Diagnoses    Encounter for gynecological examination without abnormal finding    -  Primary   Relevant Orders   Executive panel   Vitamin D (25 hydroxy)   Day 3 Follicle stimulating hormone   Day 3 Estradiol   Breast screening       Relevant Orders   MM 3D SCREEN BREAST BILATERAL   IUD check up       Screening for diabetes mellitus       Relevant Orders   Executive panel   Thyroid disorder screening       Relevant Orders   Executive panel   Lipid screening       Relevant Orders  Investment banker, operational for vitamin deficiency screening       Relevant Orders   Vitamin D (25 hydroxy)   Perimenopause       Relevant Orders   Day 3 Follicle stimulating hormone   Day 3 Estradiol      1) Mammogram - recommend yearly screening mammogram.  Mammogram Was ordered today  2) STI screening  was notoffered and therefore not obtained  3) ASCCP guidelines and rational discussed.  Patient opts for every 3 years screening interval  4) Osteoporosis  - per USPTF routine screening DEXA at age 90  5) Routine healthcare maintenance including cholesterol, diabetes screening discussed Ordered today  6) Colonoscopy - cologuard 10/30/2018, next due in 10/30/2021  7) Migraines - rx Imitrex   8) Return in about 1 year (around 08/15/2020) for annual.    Vena Austria, MD Domingo Pulse, [facility] 08/16/2019, 8:46 AM

## 2019-08-17 LAB — CMP14+LP+TP+TSH+CBC/PLT
ALT: 20 IU/L (ref 0–32)
AST: 19 IU/L (ref 0–40)
Albumin/Globulin Ratio: 1.7 (ref 1.2–2.2)
Albumin: 4.3 g/dL (ref 3.8–4.8)
Alkaline Phosphatase: 42 IU/L (ref 39–117)
BUN/Creatinine Ratio: 19 (ref 9–23)
BUN: 16 mg/dL (ref 6–24)
Bilirubin Total: 0.4 mg/dL (ref 0.0–1.2)
CO2: 21 mmol/L (ref 20–29)
Calcium: 9.6 mg/dL (ref 8.7–10.2)
Chloride: 103 mmol/L (ref 96–106)
Cholesterol, Total: 171 mg/dL (ref 100–199)
Creatinine, Ser: 0.83 mg/dL (ref 0.57–1.00)
Free Thyroxine Index: 2.8 (ref 1.2–4.9)
GFR calc Af Amer: 95 mL/min/{1.73_m2} (ref >59)
GFR calc non Af Amer: 82 mL/min/{1.73_m2} (ref >59)
Globulin, Total: 2.5 g/dL (ref 1.5–4.5)
Glucose: 92 mg/dL (ref 65–99)
HDL: 66 mg/dL (ref >39)
Hematocrit: 43.3 % (ref 34.0–46.6)
Hemoglobin: 14.7 g/dL (ref 11.1–15.9)
LDL Chol Calc (NIH): 94 mg/dL (ref 0–99)
LDL/HDL Ratio: 1.4 ratio (ref 0.0–3.2)
MCH: 31.9 pg (ref 26.6–33.0)
MCHC: 33.9 g/dL (ref 31.5–35.7)
MCV: 94 fL (ref 79–97)
Platelets: 287 10*3/uL (ref 150–450)
Potassium: 4.6 mmol/L (ref 3.5–5.2)
RBC: 4.61 x10E6/uL (ref 3.77–5.28)
RDW: 12.5 % (ref 11.7–15.4)
Sodium: 137 mmol/L (ref 134–144)
T3 Uptake Ratio: 33 % (ref 24–39)
T4, Total: 8.4 ug/dL (ref 4.5–12.0)
TSH: 2.04 u[IU]/mL (ref 0.450–4.500)
Total Protein: 6.8 g/dL (ref 6.0–8.5)
Triglycerides: 55 mg/dL (ref 0–149)
VLDL Cholesterol Cal: 11 mg/dL (ref 5–40)
WBC: 5.7 10*3/uL (ref 3.4–10.8)

## 2019-08-17 LAB — VITAMIN D 25 HYDROXY (VIT D DEFICIENCY, FRACTURES): Vit D, 25-Hydroxy: 32.1 ng/mL (ref 30.0–100.0)

## 2019-08-17 LAB — ESTRADIOL: Estradiol: 108 pg/mL

## 2019-08-17 LAB — FOLLICLE STIMULATING HORMONE: FSH: 2.9 m[IU]/mL

## 2019-08-30 ENCOUNTER — Other Ambulatory Visit: Payer: Self-pay

## 2019-08-30 ENCOUNTER — Ambulatory Visit
Admission: RE | Admit: 2019-08-30 | Discharge: 2019-08-30 | Disposition: A | Discharge status: Home / Self Care | Payer: Managed Care, Other (non HMO) | Source: Ambulatory Visit | Attending: Obstetrics and Gynecology | Admitting: Obstetrics and Gynecology

## 2019-08-30 DIAGNOSIS — Z1231 Encounter for screening mammogram for malignant neoplasm of breast: Secondary | ICD-10-CM | POA: Diagnosis not present

## 2019-08-30 DIAGNOSIS — Z1239 Encounter for other screening for malignant neoplasm of breast: Secondary | ICD-10-CM

## 2020-04-21 ENCOUNTER — Other Ambulatory Visit: Payer: Self-pay | Admitting: Obstetrics and Gynecology

## 2020-08-11 IMAGING — MG DIGITAL SCREENING BILAT W/ TOMO W/ CAD
8 series · 8 of 24 positions shown · non-contrast
Comparison: Previous exam(s).

CLINICAL DATA: Screening.

EXAM:
DIGITAL SCREENING BILATERAL MAMMOGRAM WITH TOMO AND CAD

[R CC synth-2D]
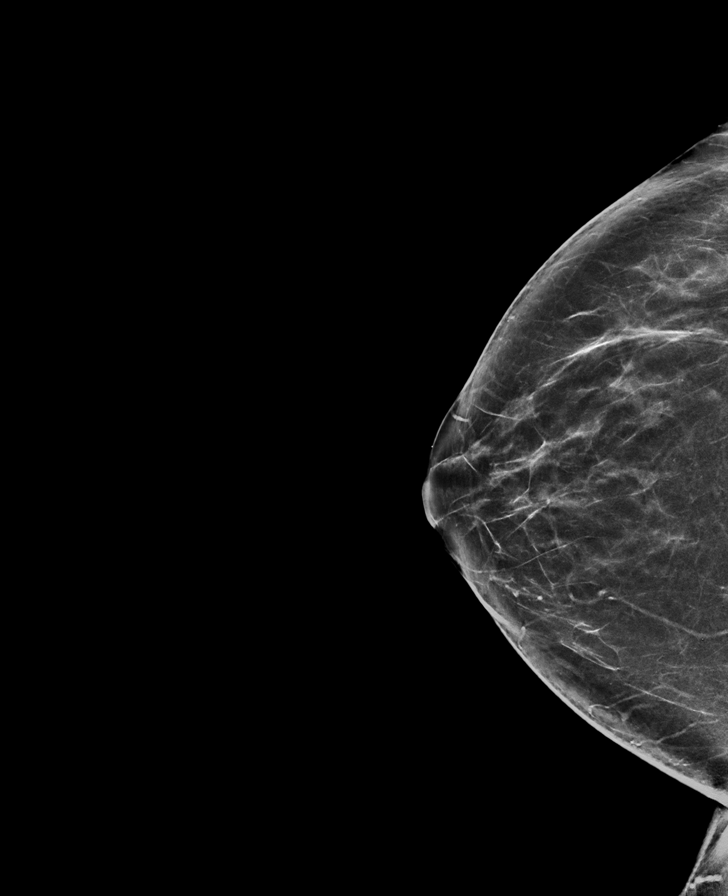

[L CC synth-2D]
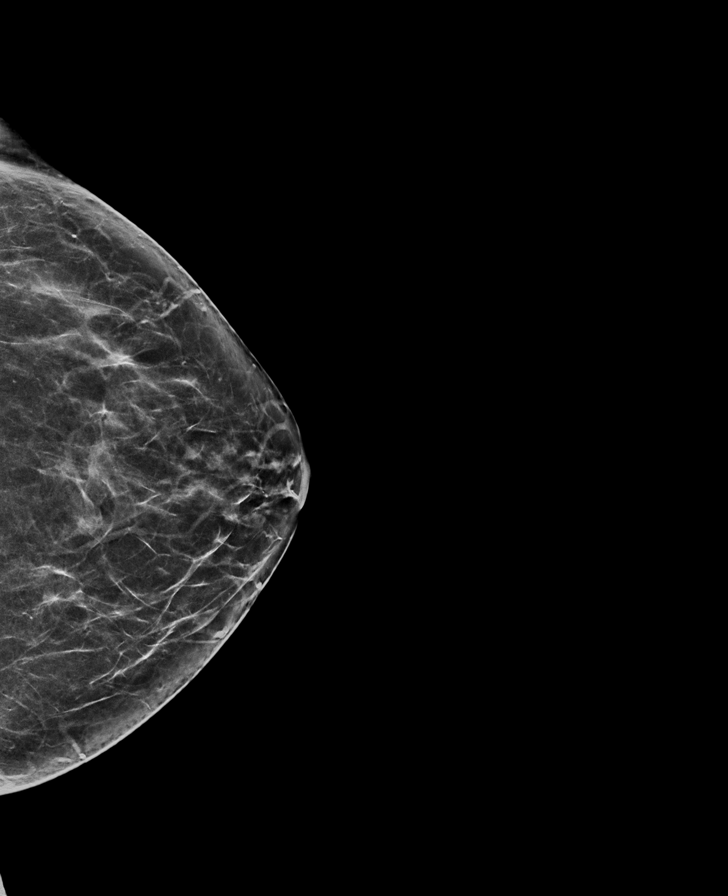

[L MLO synth-2D]
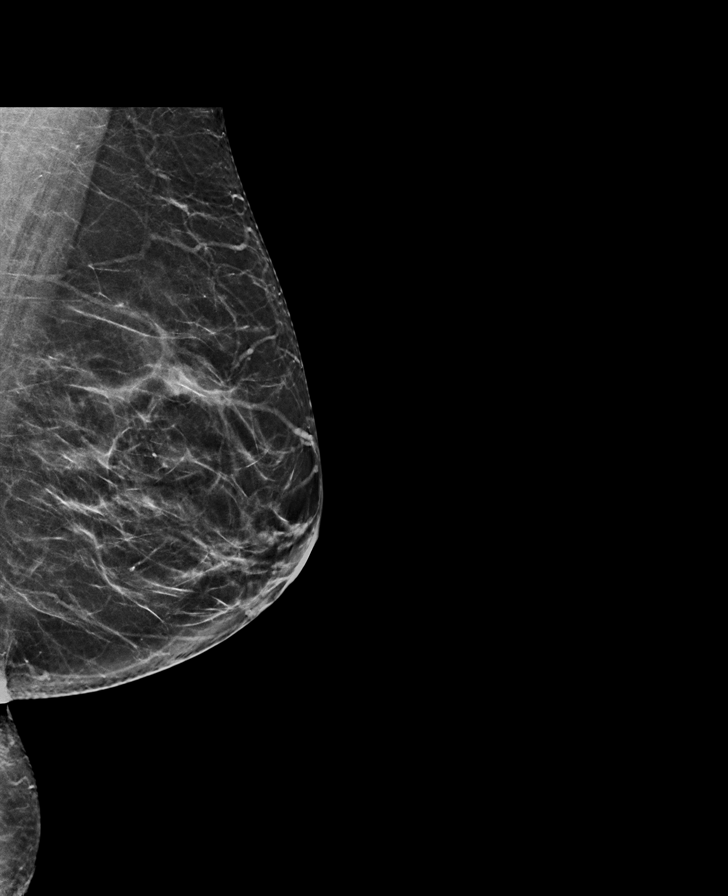

[R MLO synth-2D]
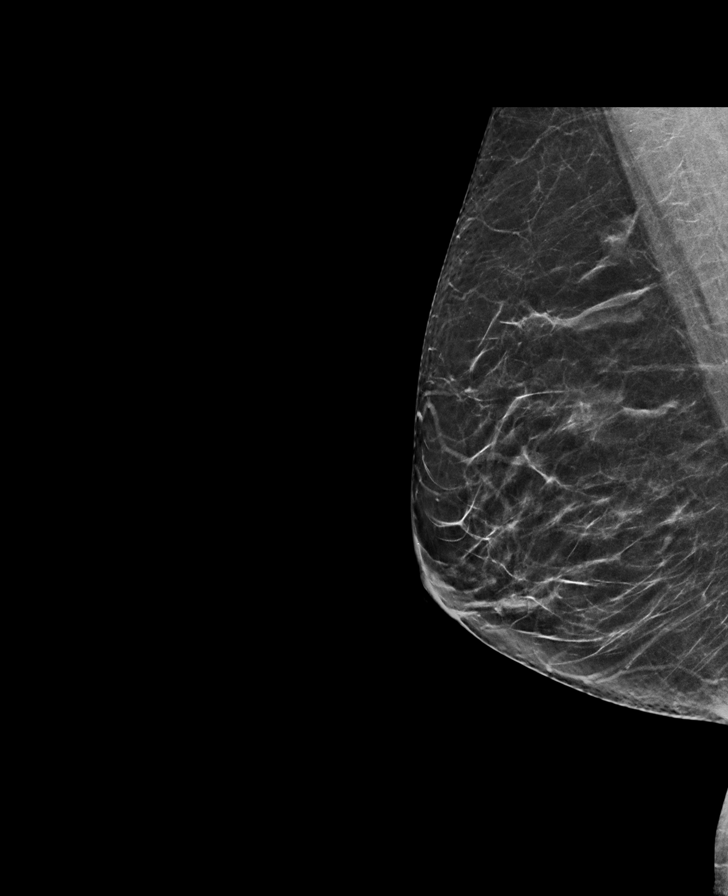

[R CC tomo · tomo slice 37/72.0]
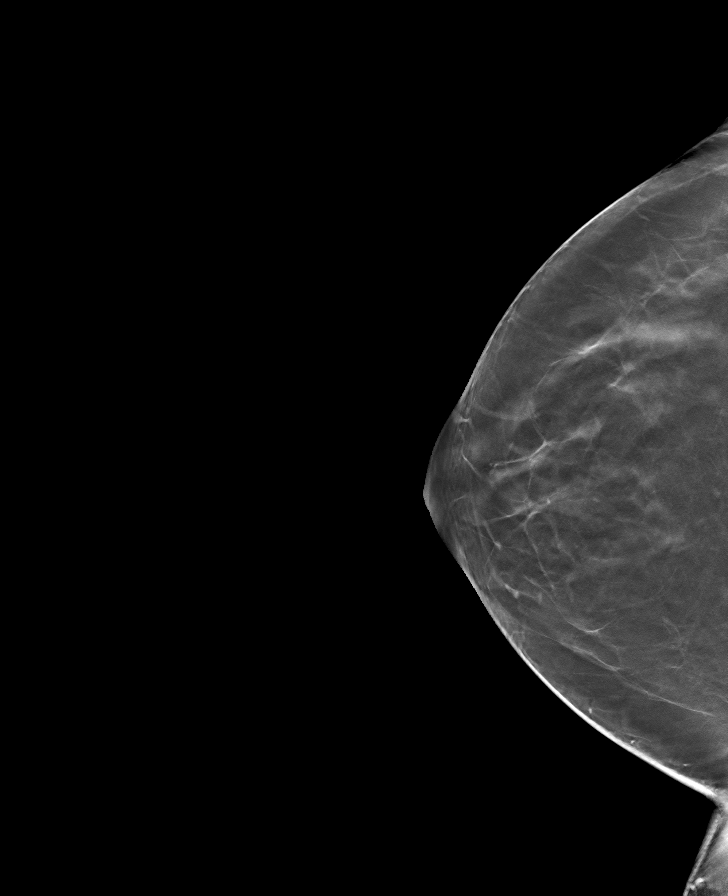

[L CC tomo · tomo slice 33/65.0]
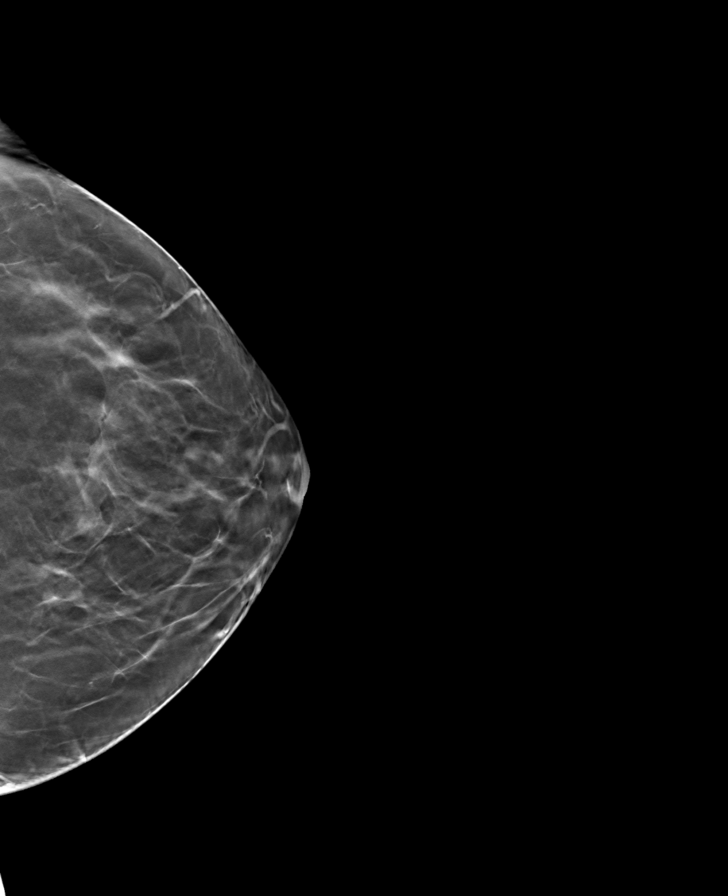

[L MLO tomo · tomo slice 33/65.0]
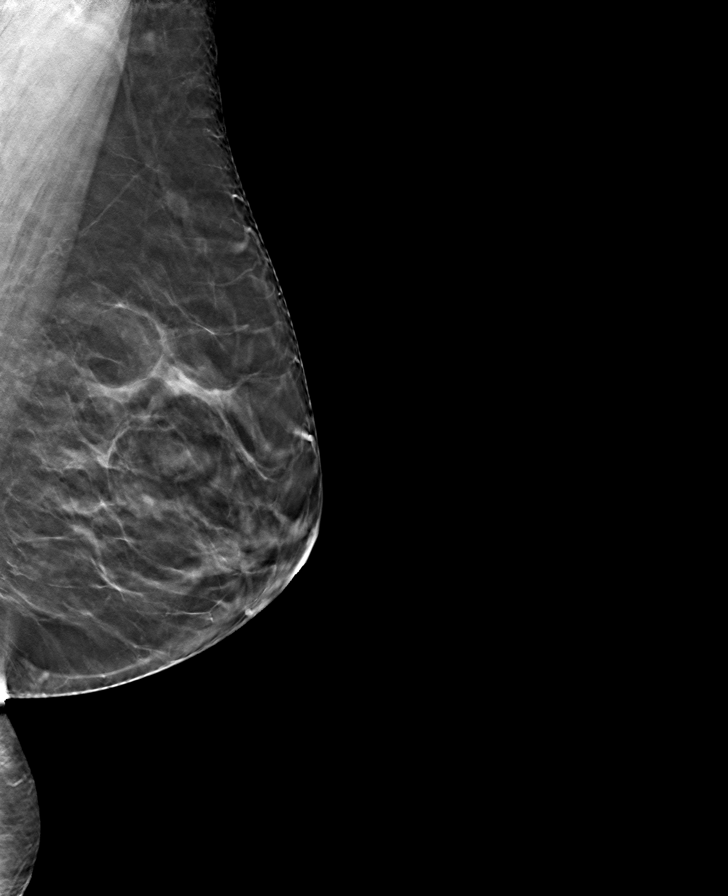

[R MLO tomo · tomo slice 34/67.0]
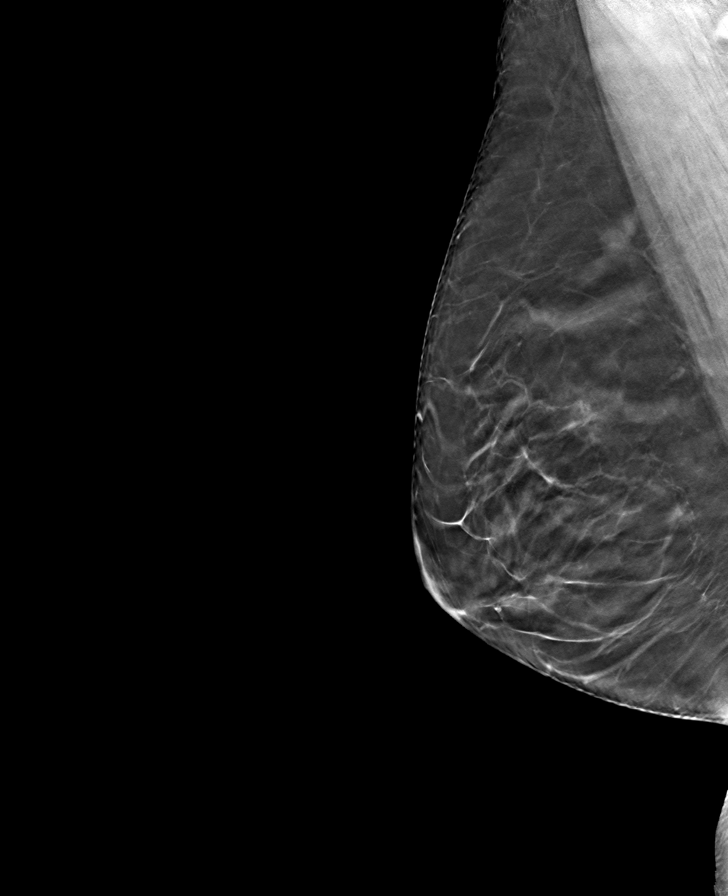

[8 of 24 positions shown; findings below may reference images not displayed]

ACR Breast Density Category b: There are scattered areas of
fibroglandular density.
FINDINGS: There are no findings suspicious for malignancy. Images were
processed with CAD.
IMPRESSION: No mammographic evidence of malignancy. A result letter of this
screening mammogram will be mailed directly to the patient.

RECOMMENDATION:
Screening mammogram in one year. (Code:[id number]-775)

BI-RADS CATEGORY  1: Negative.

## 2020-09-05 ENCOUNTER — Ambulatory Visit (INDEPENDENT_AMBULATORY_CARE_PROVIDER_SITE_OTHER): Payer: Managed Care, Other (non HMO) | Admitting: Obstetrics and Gynecology

## 2020-09-05 ENCOUNTER — Encounter: Payer: Self-pay | Admitting: Obstetrics and Gynecology

## 2020-09-05 ENCOUNTER — Other Ambulatory Visit: Payer: Self-pay

## 2020-09-05 VITALS — BP 144/94 | Ht 64.0 in | Wt 171.0 lb

## 2020-09-05 DIAGNOSIS — Z131 Encounter for screening for diabetes mellitus: Secondary | ICD-10-CM

## 2020-09-05 DIAGNOSIS — Z13220 Encounter for screening for lipoid disorders: Secondary | ICD-10-CM | POA: Diagnosis not present

## 2020-09-05 DIAGNOSIS — E039 Hypothyroidism, unspecified: Secondary | ICD-10-CM

## 2020-09-05 DIAGNOSIS — Z1329 Encounter for screening for other suspected endocrine disorder: Secondary | ICD-10-CM

## 2020-09-05 DIAGNOSIS — Z1239 Encounter for other screening for malignant neoplasm of breast: Secondary | ICD-10-CM | POA: Diagnosis not present

## 2020-09-05 DIAGNOSIS — Z113 Encounter for screening for infections with a predominantly sexual mode of transmission: Secondary | ICD-10-CM

## 2020-09-05 DIAGNOSIS — N951 Menopausal and female climacteric states: Secondary | ICD-10-CM

## 2020-09-05 DIAGNOSIS — Z01419 Encounter for gynecological examination (general) (routine) without abnormal findings: Secondary | ICD-10-CM

## 2020-09-05 DIAGNOSIS — R030 Elevated blood-pressure reading, without diagnosis of hypertension: Secondary | ICD-10-CM

## 2020-09-05 MED ORDER — PROPRANOLOL HCL 10 MG PO TABS: 10.0000 mg | ORAL_TABLET | Freq: Three times a day (TID) | ORAL | 3 refills | Status: DC | PRN

## 2020-09-05 MED ORDER — LORAZEPAM 1 MG PO TABS: 1.0000 mg | ORAL_TABLET | Freq: Three times a day (TID) | ORAL | 0 refills | Status: DC | PRN

## 2020-09-05 MED ORDER — SUMATRIPTAN SUCCINATE 100 MG PO TABS: 100.0000 mg | ORAL_TABLET | Freq: Once | ORAL | 11 refills | Status: DC | PRN

## 2020-09-05 MED ORDER — LEVOTHYROXINE SODIUM 88 MCG PO TABS: 88.0000 ug | ORAL_TABLET | Freq: Every day | ORAL | 3 refills | Status: DC

## 2020-09-05 NOTE — Patient Instructions (Signed)
[facility] [address] [place] [address]  [facility] [place]  [address] [place] [address]  Phone: [phone]  

## 2020-09-05 NOTE — Progress Notes (Signed)
Routine labs([employer] employee), need refills also. RM 3

## 2020-09-05 NOTE — Progress Notes (Signed)
Gynecology Annual Exam  PCP: Holly Austria, MD  Chief Complaint:  Chief Complaint  Patient presents with  . Gynecologic Exam    Routine labs([employer] employee), need refills also. RM 3    History of Present Illness:Patient is a 54 y.o. [name] presents for annual exam. The patient has no complaints today.   LMP: No LMP recorded. (Menstrual status: IUD). Mostly amenorrhea with occasional breakthrough period on IUD  The patient is sexually active. She denies dyspareunia.  The patient does perform self breast exams.  There is no notable family history of breast or ovarian cancer in her family.  The patient wears seatbelts: yes.   The patient has regular exercise: not asked.    The patient denies current symptoms of depression.   Did have a 3 month period of panic attack at night, proceeded prepping for board exam and now resolved.  Review of Systems: Review of Systems  Constitutional: Negative for chills and fever.  HENT: Negative for congestion.   Respiratory: Negative for cough and shortness of breath.   Cardiovascular: Negative for chest pain and palpitations.  Gastrointestinal: Negative for abdominal pain, constipation, diarrhea, heartburn, nausea and vomiting.  Genitourinary: Negative for dysuria, frequency and urgency.  Skin: Negative for itching and rash.  Neurological: Negative for dizziness and headaches.  Endo/Heme/Allergies: Negative for polydipsia.  Psychiatric/Behavioral: Negative for depression.    Past Medical History:  There are no problems to display for this patient.   Past Surgical History:  Past Surgical History:  Procedure Laterality Date  . COLPOSCOPY  2008   Mild Dysplasia  . ECC  2008   Benign Tissue  . Mirena  01/25/07 & 01/07/2012    Gynecologic History:  No LMP recorded. (Menstrual status: IUD). Last Pap: Results were: 07/27/2018 NIL and HR HPV negative  Last mammogram: 08/30/2019 Results were: BI-RAD I  Contraception: Mirena IUD  10/24/2018  Obstetric History: [id number]  Family History:  Family History  Problem Relation Age of Onset  . Breast cancer Neg Hx     Social History:  Social History   Socioeconomic History  . Marital status: Married    Spouse name: Not on file  . Number of children: Not on file  . Years of education: Not on file  . Highest education level: Not on file  Occupational History  . Not on file  Tobacco Use  . Smoking status: Never Smoker  . Smokeless tobacco: Never Used  Vaping Use  . Vaping Use: Never used  Substance and Sexual Activity  . Alcohol use: Yes  . Drug use: No  . Sexual activity: Yes    Partners: Male    Birth control/protection: I.U.D.  Other Topics Concern  . Not on file  Social History Narrative  . Not on file   Social Determinants of Health   Financial Resource Strain: Not on file  Food Insecurity: Not on file  Transportation Needs: Not on file  Physical Activity: Not on file  Stress: Not on file  Social Connections: Not on file  Intimate Partner Violence: Not on file    Allergies:  Allergies  Allergen Reactions  . Propylthiouracil Rash    Medications: Prior to Admission medications   Medication Sig Start Date End Date Taking? Authorizing Provider  Biotin 1000 MCG CHEW Chew by mouth.   Yes [provider]  levonorgestrel (MIRENA) 20 MCG/24HR IUD by Intrauterine route.   Yes [provider]  levothyroxine (SYNTHROID) 88 MCG tablet TAKE 1 TABLET BY MOUTH  DAILY 04/22/20  Yes Holly Austria, MD  LORazepam (ATIVAN) 1 MG tablet Take 1 tablet (1 mg total) by mouth every 8 (eight) hours as needed for anxiety. 07/27/18  Yes Holly Austria, MD  Lysine 500 MG TABS Take by mouth.   Yes [provider]  Multiple Vitamins-Minerals (MULTIVITAMIN WITH MINERALS) tablet Take by mouth. 03/30/03  Yes [provider]  propranolol (INDERAL) 10 MG tablet Take 1 tablet (10 mg total) by mouth 3 (three) times daily as needed  (anxiety). 07/27/18  Yes Holly Austria, MD  SUMAtriptan (IMITREX) 100 MG tablet Take 1 tablet (100 mg total) by mouth once as needed for up to 1 dose for migraine. May repeat in 2 hours if headache persists or recurs. 08/16/19  Yes Holly Austria, MD  phentermine (ADIPEX-P) 37.5 MG tablet Take 1 tablet (37.5 mg total) by mouth daily before breakfast. Patient not taking: Reported on 11/21/2018 10/24/18   Holly Austria, MD  vitamin B-12 (CYANOCOBALAMIN) 1000 MCG tablet Take 1,000 mcg by mouth daily. Patient not taking: Reported on 09/05/2020    [provider]    Physical Exam Vitals: Blood pressure (!) 144/94, height 5\' 4"  (1.626 m), weight 171 lb (77.6 kg).  General: NAD HEENT: normocephalic, anicteric Thyroid: no enlargement, no palpable nodules Pulmonary: No increased work of breathing, CTAB Cardiovascular: RRR, distal pulses 2+ Breast: Breast symmetrical, no tenderness, no palpable nodules or masses, no skin or nipple retraction present, no nipple discharge.  No axillary or supraclavicular lymphadenopathy. Abdomen: NABS, soft, non-tender, non-distended.  Umbilicus without lesions.  No hepatomegaly, splenomegaly or masses palpable. No evidence of hernia  Genitourinary:  External: Normal external female genitalia.  Normal urethral meatus, normal Bartholin's and Skene's glands.    Vagina: Normal vaginal mucosa, no evidence of prolapse.    Cervix: Grossly normal in appearance, no bleeding, IUD strings visualized 2cm  Uterus: Non-enlarged, mobile, normal contour.  No CMT  Adnexa: ovaries non-enlarged, no adnexal masses  Rectal: deferred  Lymphatic: no evidence of inguinal lymphadenopathy Extremities: no edema, erythema, or tenderness Neurologic: Grossly intact Psychiatric: mood appropriate, affect full  Female chaperone present for pelvic and breast  portions of the physical exam  Immunization History  Administered Date(s) Administered  . Influenza-Unspecified  07/12/2020  . PFIZER SARS-COV-2 Vaccination 10/24/2019, 02/Holly/2021    Assessment: 54 y.o. 44 routine annual exam  Plan: Problem List Items Addressed This Visit   None   Visit Diagnoses    Encounter for gynecological examination without abnormal finding    -  Primary   Relevant Orders   CMP14+LP+TP+TSH+CBC/Plt   Breast screening       Relevant Orders   MM DIAG BREAST TOMO BILATERAL   Routine screening for STI (sexually transmitted infection)       Lipid screening       Relevant Orders   CMP14+LP+TP+TSH+CBC/Plt   Thyroid disorder screening       Relevant Orders   CMP14+LP+TP+TSH+CBC/Plt   Vasomotor symptoms due to menopause       Relevant Orders   Follicle stimulating hormone   Estradiol   Screening for diabetes mellitus       Relevant Orders   CMP14+LP+TP+TSH+CBC/Plt   Hypothyroidism, unspecified type       Relevant Medications   propranolol (INDERAL) 10 MG tablet   levothyroxine (SYNTHROID) 88 MCG tablet   Other Relevant Orders   CMP14+LP+TP+TSH+CBC/Plt   Elevated BP without diagnosis of hypertension          1) Mammogram - recommend yearly screening  mammogram.  Mammogram Was ordered today  2) STI screening  was notoffered and therefore not obtained  3) ASCCP guidelines and rational discussed.  Patient opts for every 3 years screening interval  4) Osteoporosis  - per USPTF routine screening DEXA at age 18  5) Routine healthcare maintenance including cholesterol, diabetes screening obtained last year and all normal.  Repeat this year including FSH/estradiol to monitor on when IUD may be discontinued.     6) Colonoscopy - Cologuard negative 10/30/2018 next 2023  7) Elevated BP without diagnosis of HTN - monitor has ability to monitor at home/work  8) Return in about 1 year (around 09/05/2021) for annual.    Holly Austria, MD [facility], [facility] 09/05/2020, 8:31 AM

## 2020-09-06 LAB — CMP14+LP+TP+TSH+CBC/PLT
ALT: 18 IU/L (ref 0–32)
AST: 16 IU/L (ref 0–40)
Albumin/Globulin Ratio: 1.9 (ref 1.2–2.2)
Albumin: 4.6 g/dL (ref 3.8–4.9)
Alkaline Phosphatase: 48 IU/L (ref 44–121)
BUN/Creatinine Ratio: 22 (ref 9–23)
BUN: 18 mg/dL (ref 6–24)
Bilirubin Total: 0.3 mg/dL (ref 0.0–1.2)
CO2: 21 mmol/L (ref 20–29)
Calcium: 9.7 mg/dL (ref 8.7–10.2)
Chloride: 104 mmol/L (ref 96–106)
Cholesterol, Total: 214 mg/dL — ABNORMAL HIGH (ref 100–199)
Creatinine, Ser: 0.81 mg/dL (ref 0.57–1.00)
Free Thyroxine Index: 2.5 (ref 1.2–4.9)
GFR calc Af Amer: 97 mL/min/{1.73_m2} (ref >59)
GFR calc non Af Amer: 84 mL/min/{1.73_m2} (ref >59)
Globulin, Total: 2.4 g/dL (ref 1.5–4.5)
Glucose: 95 mg/dL (ref 65–99)
HDL: 71 mg/dL (ref >39)
Hematocrit: 43.5 % (ref 34.0–46.6)
Hemoglobin: 15 g/dL (ref 11.1–15.9)
LDL Chol Calc (NIH): 134 mg/dL — ABNORMAL HIGH (ref 0–99)
LDL/HDL Ratio: 1.9 ratio (ref 0.0–3.2)
MCH: 31.8 pg (ref 26.6–33.0)
MCHC: 34.5 g/dL (ref 31.5–35.7)
MCV: 92 fL (ref 79–97)
Platelets: 310 10*3/uL (ref 150–450)
Potassium: 4.5 mmol/L (ref 3.5–5.2)
RBC: 4.71 x10E6/uL (ref 3.77–5.28)
RDW: 12.2 % (ref 11.7–15.4)
Sodium: 138 mmol/L (ref 134–144)
T3 Uptake Ratio: 30 % (ref 24–39)
T4, Total: 8.2 ug/dL (ref 4.5–12.0)
TSH: 1.54 u[IU]/mL (ref 0.450–4.500)
Total Protein: 7 g/dL (ref 6.0–8.5)
Triglycerides: 51 mg/dL (ref 0–149)
VLDL Cholesterol Cal: 9 mg/dL (ref 5–40)
WBC: 5.4 10*3/uL (ref 3.4–10.8)

## 2020-09-06 LAB — FOLLICLE STIMULATING HORMONE: FSH: 11.5 m[IU]/mL

## 2020-09-06 LAB — ESTRADIOL: Estradiol: 60.5 pg/mL

## 2020-09-16 ENCOUNTER — Other Ambulatory Visit: Payer: Self-pay | Admitting: Obstetrics and Gynecology

## 2020-09-16 DIAGNOSIS — Z1231 Encounter for screening mammogram for malignant neoplasm of breast: Secondary | ICD-10-CM

## 2020-10-09 ENCOUNTER — Ambulatory Visit: Payer: Managed Care, Other (non HMO)

## 2020-10-23 ENCOUNTER — Other Ambulatory Visit: Payer: Self-pay

## 2020-10-23 ENCOUNTER — Ambulatory Visit
Admission: RE | Admit: 2020-10-23 | Discharge: 2020-10-23 | Disposition: A | Discharge status: Home / Self Care | Payer: Managed Care, Other (non HMO) | Source: Ambulatory Visit | Attending: Obstetrics and Gynecology | Admitting: Obstetrics and Gynecology

## 2020-10-23 DIAGNOSIS — Z1231 Encounter for screening mammogram for malignant neoplasm of breast: Secondary | ICD-10-CM

## 2021-09-01 NOTE — Telephone Encounter (Signed)
Mirena rcvd/charged 10/24/2018

## 2021-09-05 ENCOUNTER — Other Ambulatory Visit: Payer: Self-pay | Admitting: Obstetrics and Gynecology

## 2021-10-14 ENCOUNTER — Other Ambulatory Visit: Payer: Self-pay

## 2021-10-14 ENCOUNTER — Ambulatory Visit (INDEPENDENT_AMBULATORY_CARE_PROVIDER_SITE_OTHER): Payer: Managed Care, Other (non HMO) | Admitting: Obstetrics and Gynecology

## 2021-10-14 ENCOUNTER — Encounter: Payer: Self-pay | Admitting: Obstetrics and Gynecology

## 2021-10-14 VITALS — BP 142/90 | Ht 64.0 in | Wt 184.0 lb

## 2021-10-14 DIAGNOSIS — Z01419 Encounter for gynecological examination (general) (routine) without abnormal findings: Secondary | ICD-10-CM | POA: Diagnosis not present

## 2021-10-14 DIAGNOSIS — Z131 Encounter for screening for diabetes mellitus: Secondary | ICD-10-CM

## 2021-10-14 DIAGNOSIS — Z1329 Encounter for screening for other suspected endocrine disorder: Secondary | ICD-10-CM | POA: Diagnosis not present

## 2021-10-14 DIAGNOSIS — N951 Menopausal and female climacteric states: Secondary | ICD-10-CM

## 2021-10-14 DIAGNOSIS — Z1321 Encounter for screening for nutritional disorder: Secondary | ICD-10-CM

## 2021-10-14 DIAGNOSIS — Z13220 Encounter for screening for lipoid disorders: Secondary | ICD-10-CM

## 2021-10-14 DIAGNOSIS — Z0000 Encounter for general adult medical examination without abnormal findings: Secondary | ICD-10-CM

## 2021-10-14 DIAGNOSIS — Z1239 Encounter for other screening for malignant neoplasm of breast: Secondary | ICD-10-CM | POA: Diagnosis not present

## 2021-10-14 NOTE — Patient Instructions (Signed)
[facility] [address]  [place] [address]  [facility] [place]  [address] [place] [address]  Phone: [phone]  

## 2021-10-14 NOTE — Progress Notes (Signed)
Gynecology Annual Exam  PCP: Vena Austria, MD  Chief Complaint:  Chief Complaint  Patient presents with   Annual Exam   Urinary Incontinence    History of Present Illness:Patient is a 56 y.o. [id number] presents for annual exam. The patient has no complaints today.   LMP: No LMP recorded. (Menstrual status: IUD). No bleeding concerns  The patient is sexually active. She denies dyspareunia.  The patient does perform self breast exams.  There is no notable family history of breast or ovarian cancer in her family.  The patient wears seatbelts: yes.   The patient has regular exercise: not asked.    The patient denies current symptoms of depression.     Review of Systems: Review of Systems  Constitutional:  Negative for chills and fever.  HENT:  Negative for congestion.   Respiratory:  Negative for cough and shortness of breath.   Cardiovascular:  Negative for chest pain and palpitations.  Gastrointestinal:  Negative for abdominal pain, constipation, diarrhea, heartburn, nausea and vomiting.  Genitourinary:  Negative for dysuria, frequency and urgency.  Skin:  Negative for itching and rash.  Neurological:  Negative for dizziness and headaches.  Endo/Heme/Allergies:  Negative for polydipsia.  Psychiatric/Behavioral:  Negative for depression.    Past Medical History:  There are no problems to display for this patient.   Past Surgical History:  Past Surgical History:  Procedure Laterality Date   COLPOSCOPY  2008   Mild Dysplasia   ECC  2008   Benign Tissue   Mirena  01/25/07 & 01/07/2012    Gynecologic History:  No LMP recorded. (Menstrual status: IUD). Last Pap: Results were: 10/13/2019NIL and HR HPV negative  Last mammogram: 10/23/20 Results were: Mady Gemma 10/24/2018  Obstetric History: [id number]  Family History:  Family History  Problem Relation Age of Onset   Breast cancer Neg Hx     Social History:  Social History   Socioeconomic History    Marital status: Married    Spouse name: Not on file   Number of children: Not on file   Years of education: Not on file   Highest education level: Not on file  Occupational History   Not on file  Tobacco Use   Smoking status: Never   Smokeless tobacco: Never  Vaping Use   Vaping Use: Never used  Substance and Sexual Activity   Alcohol use: Yes   Drug use: No   Sexual activity: Yes    Partners: Male    Birth control/protection: I.U.D.  Other Topics Concern   Not on file  Social History Narrative   Not on file   Social Determinants of Health   Financial Resource Strain: Not on file  Food Insecurity: Not on file  Transportation Needs: Not on file  Physical Activity: Not on file  Stress: Not on file  Social Connections: Not on file  Intimate Partner Violence: Not on file    Allergies:  Allergies  Allergen Reactions   Propylthiouracil Rash    Medications: Prior to Admission medications   Medication Sig Start Date End Date Taking? Authorizing Provider  Biotin 1000 MCG CHEW Chew by mouth.   Yes [provider]  levonorgestrel (MIRENA) 20 MCG/24HR IUD by Intrauterine route.   Yes [provider]  levothyroxine (SYNTHROID) 88 MCG tablet TAKE 1 TABLET BY MOUTH  DAILY 09/07/21  Yes Vena Austria, MD  LORazepam (ATIVAN) 1 MG tablet Take 1 tablet (1 mg total) by mouth every 8 (eight) hours  as needed for anxiety. 09/05/20  Yes Vena AustriaStaebler, Caytlyn Evers, MD  Lysine 500 MG TABS Take by mouth.   Yes [provider]  Multiple Vitamins-Minerals (MULTIVITAMIN WITH MINERALS) tablet Take by mouth. 03/30/03  Yes [provider]  propranolol (INDERAL) 10 MG tablet Take 1 tablet (10 mg total) by mouth 3 (three) times daily as needed (anxiety). 09/05/20  Yes Vena AustriaStaebler, Juston Goheen, MD  SUMAtriptan (IMITREX) 100 MG tablet Take 1 tablet (100 mg total) by mouth once as needed for up to 1 dose for migraine. May repeat in 2 hours if headache persists or recurs. 09/05/20  Yes  Vena AustriaStaebler, Dulcinea Kinser, MD    Physical Exam Vitals: Blood pressure (!) 142/90, height 5\' 4"  (1.626 m), weight 184 lb (83.5 kg).  General: NAD HEENT: normocephalic, anicteric Thyroid: no enlargement, no palpable nodules Pulmonary: No increased work of breathing, CTAB Cardiovascular: RRR, distal pulses 2+ Breast: Breast symmetrical, no tenderness, no palpable nodules or masses, no skin or nipple retraction present, no nipple discharge.  No axillary or supraclavicular lymphadenopathy. Abdomen: NABS, soft, non-tender, non-distended.  Umbilicus without lesions.  No hepatomegaly, splenomegaly or masses palpable. No evidence of hernia  Genitourinary:  External: Normal external female genitalia.  Normal urethral meatus, normal Bartholin's and Skene's glands.    Vagina: Normal vaginal mucosa, no evidence of prolapse.    Cervix: Grossly normal in appearance, no bleeding, IUD strings visualized  Uterus: Non-enlarged, mobile, normal contour.  No CMT  Adnexa: ovaries non-enlarged, no adnexal masses  Rectal: deferred  Lymphatic: no evidence of inguinal lymphadenopathy Extremities: no edema, erythema, or tenderness Neurologic: Grossly intact Psychiatric: mood appropriate, affect full  Female chaperone present for pelvic and breast  portions of the physical exam     Assessment: 56 y.o. [id number] routine annual exam  Plan: Problem List Items Addressed This Visit   None Visit Diagnoses     Encounter for gynecological examination without abnormal finding    -  Primary   Breast screening       Relevant Orders   MM 3D SCREEN BREAST BILATERAL   Thyroid disorder screening       Relevant Orders   TSH (Completed)   Screening for diabetes mellitus       Relevant Orders   Comprehensive metabolic panel (Completed)   Lipid screening       Relevant Orders   Lipid panel (Completed)   Laboratory exam ordered as part of routine general medical examination       Relevant Orders   CBC With Differential  (Completed)   Comprehensive metabolic panel (Completed)   TSH (Completed)   Lipid panel (Completed)   Vitamin D (25 hydroxy) (Completed)   FSH (Completed)   Estradiol (Completed)   Encounter for vitamin deficiency screening       Relevant Orders   Vitamin D (25 hydroxy) (Completed)   Menopausal vasomotor syndrome       Relevant Orders   FSH (Completed)   Estradiol (Completed)       1) Mammogram - recommend yearly screening mammogram.  Mammogram Was ordered today  2) STI screening  was notoffered and therefore not obtained  3) ASCCP guidelines and rational discussed.  Patient opts for every 5 years screening interval  4) Osteoporosis  - per USPTF routine screening DEXA at age 56   5) Routine healthcare maintenance including cholesterol, diabetes screening discussed Ordered today  6) Return in about 1 year (around 10/14/2022) for annual.    Vena AustriaAndreas Modelle Vollmer, MD [facility], [facility]  10/14/2021, 9:58 AM

## 2021-10-15 ENCOUNTER — Ambulatory Visit: Payer: Managed Care, Other (non HMO) | Admitting: Advanced Practice Midwife

## 2021-10-15 ENCOUNTER — Other Ambulatory Visit: Payer: Self-pay | Admitting: Obstetrics and Gynecology

## 2021-10-15 ENCOUNTER — Encounter: Payer: Self-pay | Admitting: Obstetrics and Gynecology

## 2021-10-15 LAB — CBC WITH DIFFERENTIAL
Basophils Absolute: 0 10*3/uL (ref 0.0–0.2)
Basos: 0 %
EOS (ABSOLUTE): 0.1 10*3/uL (ref 0.0–0.4)
Eos: 1 %
Hematocrit: 42.6 % (ref 34.0–46.6)
Hemoglobin: 14.5 g/dL (ref 11.1–15.9)
Immature Grans (Abs): 0 10*3/uL (ref 0.0–0.1)
Immature Granulocytes: 0 %
Lymphocytes Absolute: 1.4 10*3/uL (ref 0.7–3.1)
Lymphs: 17 %
MCH: 31.9 pg (ref 26.6–33.0)
MCHC: 34 g/dL (ref 31.5–35.7)
MCV: 94 fL (ref 79–97)
Monocytes Absolute: 0.5 10*3/uL (ref 0.1–0.9)
Monocytes: 6 %
Neutrophils Absolute: 6.3 10*3/uL (ref 1.4–7.0)
Neutrophils: 76 %
RBC: 4.54 x10E6/uL (ref 3.77–5.28)
RDW: 12.4 % (ref 11.7–15.4)
WBC: 8.4 10*3/uL (ref 3.4–10.8)

## 2021-10-15 LAB — COMPREHENSIVE METABOLIC PANEL
ALT: 15 IU/L (ref 0–32)
AST: 16 IU/L (ref 0–40)
Albumin/Globulin Ratio: 1.8 (ref 1.2–2.2)
Albumin: 4.6 g/dL (ref 3.8–4.9)
Alkaline Phosphatase: 51 IU/L (ref 44–121)
BUN/Creatinine Ratio: 27 — ABNORMAL HIGH (ref 9–23)
BUN: 21 mg/dL (ref 6–24)
Bilirubin Total: 0.3 mg/dL (ref 0.0–1.2)
CO2: 22 mmol/L (ref 20–29)
Calcium: 9.9 mg/dL (ref 8.7–10.2)
Chloride: 105 mmol/L (ref 96–106)
Creatinine, Ser: 0.79 mg/dL (ref 0.57–1.00)
Globulin, Total: 2.5 g/dL (ref 1.5–4.5)
Glucose: 87 mg/dL (ref 70–99)
Potassium: 4.6 mmol/L (ref 3.5–5.2)
Sodium: 141 mmol/L (ref 134–144)
Total Protein: 7.1 g/dL (ref 6.0–8.5)
eGFR: 89 mL/min/{1.73_m2} (ref >59)

## 2021-10-15 LAB — LIPID PANEL
Chol/HDL Ratio: 2.6 ratio (ref 0.0–4.4)
Cholesterol, Total: 183 mg/dL (ref 100–199)
HDL: 70 mg/dL (ref >39)
LDL Chol Calc (NIH): 103 mg/dL — ABNORMAL HIGH (ref 0–99)
Triglycerides: 52 mg/dL (ref 0–149)
VLDL Cholesterol Cal: 10 mg/dL (ref 5–40)

## 2021-10-15 LAB — FOLLICLE STIMULATING HORMONE: FSH: 76.5 m[IU]/mL

## 2021-10-15 LAB — TSH: TSH: 0.759 u[IU]/mL (ref 0.450–4.500)

## 2021-10-15 LAB — VITAMIN D 25 HYDROXY (VIT D DEFICIENCY, FRACTURES): Vit D, 25-Hydroxy: 35.9 ng/mL (ref 30.0–100.0)

## 2021-10-15 LAB — ESTRADIOL: Estradiol: 27 pg/mL

## 2021-10-15 MED ORDER — LORAZEPAM 1 MG PO TABS: 1.0000 mg | ORAL_TABLET | Freq: Three times a day (TID) | ORAL | 0 refills | Status: AC | PRN

## 2021-10-16 ENCOUNTER — Other Ambulatory Visit: Payer: Self-pay | Admitting: Obstetrics and Gynecology

## 2021-10-16 DIAGNOSIS — Z1211 Encounter for screening for malignant neoplasm of colon: Secondary | ICD-10-CM

## 2021-11-15 LAB — COLOGUARD: COLOGUARD: NEGATIVE

## 2021-11-17 ENCOUNTER — Telehealth: Payer: Self-pay

## 2021-11-17 ENCOUNTER — Other Ambulatory Visit: Payer: Self-pay

## 2021-11-17 MED ORDER — PROPRANOLOL HCL 10 MG PO TABS: 10.0000 mg | ORAL_TABLET | Freq: Three times a day (TID) | ORAL | 3 refills | Status: AC | PRN

## 2021-11-17 NOTE — Telephone Encounter (Signed)
[facility] sent a refill request for Inderal 10 mg tablet. She is a Pt of AMS. Please advise

## 2021-11-17 NOTE — Telephone Encounter (Signed)
Done

## 2021-11-19 ENCOUNTER — Other Ambulatory Visit: Payer: Self-pay

## 2021-11-19 ENCOUNTER — Ambulatory Visit
Admission: RE | Admit: 2021-11-19 | Discharge: 2021-11-19 | Disposition: A | Discharge status: Home / Self Care | Payer: Managed Care, Other (non HMO) | Source: Ambulatory Visit | Attending: Obstetrics and Gynecology | Admitting: Obstetrics and Gynecology

## 2021-11-19 DIAGNOSIS — Z1231 Encounter for screening mammogram for malignant neoplasm of breast: Secondary | ICD-10-CM | POA: Insufficient documentation

## 2021-11-19 DIAGNOSIS — Z1239 Encounter for other screening for malignant neoplasm of breast: Secondary | ICD-10-CM | POA: Diagnosis present

## 2021-11-21 ENCOUNTER — Other Ambulatory Visit: Payer: Self-pay | Admitting: Obstetrics and Gynecology

## 2021-11-21 DIAGNOSIS — N6489 Other specified disorders of breast: Secondary | ICD-10-CM

## 2021-11-21 DIAGNOSIS — R928 Other abnormal and inconclusive findings on diagnostic imaging of breast: Secondary | ICD-10-CM

## 2021-11-25 ENCOUNTER — Telehealth: Payer: Self-pay

## 2021-11-25 NOTE — Telephone Encounter (Signed)
Pt calling; needs new orders put in for repeat mammogram.  [phone]  Pt aware order were put in by [facility].

## 2021-12-05 ENCOUNTER — Telehealth: Payer: Self-pay

## 2021-12-05 NOTE — Telephone Encounter (Signed)
Reviewed chart. Patient of AMS. Spoke w/patient. Advised AMS no longer with our practice. Inquired who she plans to continue her care with. She plans to see Althea Grimmer, PAC. Patient aware she is out of office until Monday. She has a few pills left and ok to wait for refill processing on Monday. ?

## 2021-12-05 NOTE — Telephone Encounter (Signed)
Fax refill request received from [facility] for Imitrex 100mg . ?

## 2021-12-06 ENCOUNTER — Other Ambulatory Visit: Payer: Self-pay | Admitting: Obstetrics and Gynecology

## 2021-12-06 MED ORDER — SUMATRIPTAN SUCCINATE 100 MG PO TABS: 100.0000 mg | ORAL_TABLET | Freq: Once | ORAL | 2 refills | Status: AC | PRN

## 2021-12-06 NOTE — Telephone Encounter (Signed)
Rx RF eRxd.  

## 2021-12-06 NOTE — Progress Notes (Signed)
Rx RF imitrex to mail order ?

## 2021-12-08 NOTE — Telephone Encounter (Signed)
Patient aware.

## 2021-12-09 ENCOUNTER — Ambulatory Visit
Admission: RE | Admit: 2021-12-09 | Discharge: 2021-12-09 | Disposition: A | Discharge status: Home / Self Care | Payer: Managed Care, Other (non HMO) | Source: Ambulatory Visit | Attending: Obstetrics and Gynecology | Admitting: Obstetrics and Gynecology

## 2021-12-09 ENCOUNTER — Other Ambulatory Visit: Payer: Self-pay

## 2021-12-09 DIAGNOSIS — R928 Other abnormal and inconclusive findings on diagnostic imaging of breast: Secondary | ICD-10-CM

## 2021-12-09 DIAGNOSIS — N6489 Other specified disorders of breast: Secondary | ICD-10-CM

## 2021-12-10 ENCOUNTER — Encounter: Payer: Self-pay | Admitting: Obstetrics and Gynecology
